# Patient Record
Sex: Male | Born: 1985 | Race: White | Hispanic: No | Smoking: Current every day smoker
Health system: Southern US, Community
[De-identification: ages and names within clinical notes are randomized; demographics above are authoritative.]

## PROBLEM LIST (undated history)

## (undated) DIAGNOSIS — G8929 Other chronic pain: Secondary | ICD-10-CM

## (undated) DIAGNOSIS — M549 Dorsalgia, unspecified: Secondary | ICD-10-CM

---

## 2011-08-06 ENCOUNTER — Emergency Department (HOSPITAL_COMMUNITY)
Admission: EM | Admit: 2011-08-06 | Discharge: 2011-08-06 | Disposition: A | Discharge status: Home / Self Care | Payer: Self-pay | Attending: Emergency Medicine | Admitting: Emergency Medicine

## 2011-08-06 ENCOUNTER — Encounter: Payer: Self-pay | Admitting: Emergency Medicine

## 2011-08-06 DIAGNOSIS — R059 Cough, unspecified: Secondary | ICD-10-CM | POA: Insufficient documentation

## 2011-08-06 DIAGNOSIS — R509 Fever, unspecified: Secondary | ICD-10-CM | POA: Insufficient documentation

## 2011-08-06 DIAGNOSIS — J029 Acute pharyngitis, unspecified: Secondary | ICD-10-CM | POA: Insufficient documentation

## 2011-08-06 DIAGNOSIS — R05 Cough: Secondary | ICD-10-CM | POA: Insufficient documentation

## 2011-08-06 DIAGNOSIS — J069 Acute upper respiratory infection, unspecified: Secondary | ICD-10-CM | POA: Insufficient documentation

## 2011-08-06 MED ORDER — IBUPROFEN 800 MG PO TABS: 800.0000 mg | ORAL_TABLET | Freq: Three times a day (TID) | ORAL | Status: AC | PRN

## 2011-08-06 MED ORDER — IBUPROFEN 800 MG PO TABS
800.0000 mg | ORAL_TABLET | Freq: Once | ORAL | Status: AC
  Administered 2011-08-06: 800 mg via ORAL
  Filled 2011-08-06: qty 1

## 2011-08-06 MED ORDER — BENZONATATE 200 MG PO CAPS: 200.0000 mg | ORAL_CAPSULE | Freq: Three times a day (TID) | ORAL | Status: AC | PRN

## 2011-08-06 MED ORDER — BENZONATATE 100 MG PO CAPS
200.0000 mg | ORAL_CAPSULE | Freq: Once | ORAL | Status: AC
  Administered 2011-08-06: 200 mg via ORAL
  Filled 2011-08-06: qty 2

## 2011-08-06 NOTE — ED Provider Notes (Addendum)
History     CSN: [id number] Arrival date & time: 08/06/2011  4:20 PM   First MD Initiated Contact with Patient 08/06/11 1649      Chief Complaint  Patient presents with  . Sore Throat    (Consider location/radiation/quality/duration/timing/severity/associated sxs/prior treatment) Patient is a 25 y.o. male presenting with pharyngitis. The history is provided by the patient.  Sore Throat This is a new problem. Episode onset: 3 days ago. The problem occurs constantly. The problem has been unchanged. Associated symptoms include congestion, coughing and a sore throat. Pertinent negatives include no abdominal pain, arthralgias, chest pain, chills, diaphoresis, fever, headaches, joint swelling, nausea, neck pain, numbness, rash, swollen glands or weakness. The symptoms are aggravated by swallowing. He has tried nothing for the symptoms. The treatment provided no relief.    History reviewed. No pertinent past medical history.  History reviewed. No pertinent past surgical history.  History reviewed. No pertinent family history.  History  Substance Use Topics  . Smoking status: Current Everyday Smoker    Types: Cigarettes  . Smokeless tobacco: Not on file  . Alcohol Use: Yes      Review of Systems  Constitutional: Negative for fever, chills and diaphoresis.  HENT: Positive for congestion and sore throat. Negative for neck pain.   Eyes: Negative.   Respiratory: Positive for cough. Negative for chest tightness and shortness of breath.   Cardiovascular: Negative for chest pain.  Gastrointestinal: Negative for nausea and abdominal pain.  Genitourinary: Negative.   Musculoskeletal: Negative for joint swelling and arthralgias.  Skin: Negative.  Negative for rash and wound.  Neurological: Negative for dizziness, weakness, light-headedness, numbness and headaches.  Hematological: Negative.   Psychiatric/Behavioral: Negative.     Allergies  Review of patient's allergies indicates no  known allergies.  Home Medications  No current outpatient prescriptions on file.  BP 108/58  Pulse 83  Temp(Src) 97.9 F (36.6 C) (Oral)  Resp 18  Ht 5\' 6"  (1.676 m)  Wt 180 lb (81.647 kg)  BMI 29.05 kg/m2  SpO2 99%  Physical Exam  Nursing note and vitals reviewed. Constitutional: He is oriented to person, place, and time. He appears well-developed and well-nourished.  HENT:  Head: Normocephalic and atraumatic.  Mouth/Throat: Uvula is midline and mucous membranes are normal. Posterior oropharyngeal erythema present. No oropharyngeal exudate or posterior oropharyngeal edema.  Eyes: Conjunctivae are normal.  Neck: Normal range of motion.  Cardiovascular: Normal rate, regular rhythm, normal heart sounds and intact distal pulses.   Pulmonary/Chest: Effort normal and breath sounds normal. No accessory muscle usage. No respiratory distress. He has no wheezes.  Abdominal: Soft. Bowel sounds are normal. There is no tenderness.  Musculoskeletal: Normal range of motion.  Neurological: He is alert and oriented to person, place, and time.  Skin: Skin is warm and dry.  Psychiatric: He has a normal mood and affect.    ED Course  Procedures (including critical care time)   Labs Reviewed  RAPID STREP SCREEN   No results found.   No diagnosis found.    MDM  Strep negative.  Viral uri.  Advised smoking cessation.        Candis Musa, PA 08/06/11 1739  Candis Musa, PA 08/06/11 1754

## 2011-08-06 NOTE — ED Provider Notes (Signed)
Medical screening examination/treatment/procedure(s) were performed by non-physician practitioner and as supervising physician I was immediately available for consultation/collaboration.   Celene Kras, MD 08/06/11 [phone]

## 2011-08-06 NOTE — ED Notes (Signed)
Pt c/o sore throat and runny nose x 3 days. Pt states his son was just hospitalized with pneumonia.

## 2011-08-07 NOTE — ED Provider Notes (Signed)
Medical screening examination/treatment/procedure(s) were performed by non-physician practitioner and as supervising physician I was immediately available for consultation/collaboration.    Diamon Reddinger R Kalem Rockwell, MD 08/07/11 0031 

## 2013-04-23 ENCOUNTER — Encounter (HOSPITAL_COMMUNITY): Payer: Self-pay | Admitting: *Deleted

## 2013-04-23 ENCOUNTER — Emergency Department (HOSPITAL_COMMUNITY)
Admission: EM | Admit: 2013-04-23 | Discharge: 2013-04-23 | Disposition: A | Discharge status: Home / Self Care | Payer: Self-pay | Attending: Emergency Medicine | Admitting: Emergency Medicine

## 2013-04-23 ENCOUNTER — Emergency Department (HOSPITAL_COMMUNITY): Payer: No Typology Code available for payment source

## 2013-04-23 DIAGNOSIS — Y92410 Unspecified street and highway as the place of occurrence of the external cause: Secondary | ICD-10-CM | POA: Insufficient documentation

## 2013-04-23 DIAGNOSIS — T148XXA Other injury of unspecified body region, initial encounter: Secondary | ICD-10-CM

## 2013-04-23 DIAGNOSIS — G8929 Other chronic pain: Secondary | ICD-10-CM | POA: Insufficient documentation

## 2013-04-23 DIAGNOSIS — S335XXA Sprain of ligaments of lumbar spine, initial encounter: Secondary | ICD-10-CM | POA: Insufficient documentation

## 2013-04-23 DIAGNOSIS — F17200 Nicotine dependence, unspecified, uncomplicated: Secondary | ICD-10-CM | POA: Insufficient documentation

## 2013-04-23 DIAGNOSIS — R52 Pain, unspecified: Secondary | ICD-10-CM | POA: Insufficient documentation

## 2013-04-23 DIAGNOSIS — Y9389 Activity, other specified: Secondary | ICD-10-CM | POA: Insufficient documentation

## 2013-04-23 HISTORY — DX: Dorsalgia, unspecified: M54.9

## 2013-04-23 HISTORY — DX: Other chronic pain: G89.29

## 2013-04-23 MED ORDER — HYDROCODONE-ACETAMINOPHEN 5-325 MG PO TABS: ORAL_TABLET | ORAL | Status: DC

## 2013-04-23 MED ORDER — METHOCARBAMOL 500 MG PO TABS: 1000.0000 mg | ORAL_TABLET | Freq: Four times a day (QID) | ORAL | Status: DC | PRN

## 2013-04-23 MED ORDER — NAPROXEN 250 MG PO TABS: 250.0000 mg | ORAL_TABLET | Freq: Two times a day (BID) | ORAL | Status: DC

## 2013-04-23 MED ORDER — IBUPROFEN 400 MG PO TABS
400.0000 mg | ORAL_TABLET | Freq: Once | ORAL | Status: AC
  Administered 2013-04-23: 400 mg via ORAL
  Filled 2013-04-23: qty 1

## 2013-04-23 MED ORDER — OXYCODONE-ACETAMINOPHEN 5-325 MG PO TABS
2.0000 | ORAL_TABLET | Freq: Once | ORAL | Status: AC
  Administered 2013-04-23: 2 via ORAL
  Filled 2013-04-23: qty 2
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
## 2017-02-15 ENCOUNTER — Emergency Department (HOSPITAL_COMMUNITY)
Admission: EM | Admit: 2017-02-15 | Discharge: 2017-02-15 | Disposition: A | Discharge status: Home / Self Care | Payer: Self-pay | Attending: Emergency Medicine | Admitting: Emergency Medicine

## 2017-02-15 ENCOUNTER — Encounter (HOSPITAL_COMMUNITY): Payer: Self-pay

## 2017-02-15 DIAGNOSIS — F17210 Nicotine dependence, cigarettes, uncomplicated: Secondary | ICD-10-CM | POA: Insufficient documentation

## 2017-02-15 DIAGNOSIS — K029 Dental caries, unspecified: Secondary | ICD-10-CM | POA: Insufficient documentation

## 2017-02-15 MED ORDER — NAPROXEN 250 MG PO TABS: 250.0000 mg | ORAL_TABLET | Freq: Two times a day (BID) | ORAL | 0 refills | Status: DC | PRN

## 2017-02-15 MED ORDER — PENICILLIN V POTASSIUM 250 MG PO TABS: 250.0000 mg | ORAL_TABLET | Freq: Four times a day (QID) | ORAL | 0 refills | Status: DC

## 2017-02-15 NOTE — Discharge Instructions (Signed)
Take the prescriptions as directed.  Call your regular dentist on Monday to schedule a follow up appointment within the next week.  Return to the Emergency Department immediately sooner if worsening.  ° °

## 2017-02-15 NOTE — ED Notes (Signed)
Pt reports a bad top back tooth that broke off  [facility], waited three hours and then left because he had to work  No states he awakened this am with redness and swelling to his R side of face  He is swollen to his right Cheek

## 2017-02-15 NOTE — ED Triage Notes (Signed)
Pt reports pain in r side of face and swelling since waking this morning.

## 2017-02-15 NOTE — ED Provider Notes (Signed)
[facility] Provider Note   CSN: [id number] Arrival date & time: 02/15/17  1204     History   Chief Complaint Chief Complaint  Patient presents with  . Facial Pain    HPI Maurice Houston is a 31 y.o. male.  HPI  Pt was seen at 1240. Per pt, c/o gradual onset and persistence of constant right lower tooth "pain" "for a while." States the "tooth broke a while ago."  States he woke up with right sided facial swelling this morning. Denies fevers, no intra-oral edema, no rash, no dysphagia, no neck pain.   The condition is aggravated by nothing. The condition is relieved by nothing. The symptoms have been associated with no other complaints. The patient has no significant history of serious medical conditions.    Past Medical History:  Diagnosis Date  . Chronic back pain     There are no active problems to display for this patient.   History reviewed. No pertinent surgical history.     Home Medications    Prior to Admission medications   Medication Sig Start Date End Date Taking? Authorizing Provider  HYDROcodone-acetaminophen (NORCO/VICODIN) 5-325 MG per tablet 1 or 2 tabs PO q6 hours prn pain 04/23/13   Maurice JesterMcManus, Jaiven Graveline, Maurice Houston  methocarbamol (ROBAXIN) 500 MG tablet Take 2 tablets (1,000 mg total) by mouth 4 (four) times daily as needed (muscle spasm/pain). 04/23/13   Maurice JesterMcManus, Lyle Niblett, Maurice Houston  naproxen (NAPROSYN) 250 MG tablet Take 1 tablet (250 mg total) by mouth 2 (two) times daily with a meal. 04/23/13   Maurice JesterMcManus, Jakobe Blau, Maurice Houston    Family History No family history on file.  Social History Social History  Substance Use Topics  . Smoking status: Current Every Day Smoker    Packs/day: 1.00    Types: Cigarettes  . Smokeless tobacco: Never Used  . Alcohol use Yes     Comment: ocassionally     Allergies   Patient has no known allergies.   Review of Systems Review of Systems ROS: Statement: All systems negative except as marked or noted in the HPI; Constitutional:  Negative for fever and chills. ; ; Eyes: Negative for eye pain and discharge. ; ; ENMT: Positive for dental caries, dental hygiene poor and toothache. Negative for ear pain, bleeding gums, dental injury, facial deformity, hoarseness, nasal congestion, sinus pressure, sore throat, throat swelling and tongue swollen. ; ; Cardiovascular: Negative for chest pain, palpitations, diaphoresis, dyspnea and peripheral edema. ; ; Respiratory: Negative for cough, wheezing and stridor. ; ; Gastrointestinal: Negative for nausea, vomiting, diarrhea and abdominal pain. ; ; Genitourinary: Negative for dysuria, flank pain and hematuria. ; ; Musculoskeletal: Negative for back pain and neck pain. ; ; Skin: Negative for rash and skin lesion. ; ; Neuro: Negative for headache, lightheadedness and neck stiffness. ;    Physical Exam Updated Vital Signs BP 132/86 (BP Location: Right Arm)   Pulse 83   Temp 98 F (36.7 C) (Oral)   Resp 18   Ht 5\' 5"  (1.651 m)   Wt 180 lb (81.6 kg)   SpO2 100%   BMI 29.95 kg/m   Physical Exam 1245: Physical examination: Vital signs and O2 SAT: Reviewed; Constitutional: Well developed, Well nourished, Well hydrated, In no acute distress; Head and Face: Normocephalic, Atraumatic. No facial rash.; Eyes: EOMI, PERRL, No scleral icterus; ENMT: Mouth and pharynx normal, Poor dentition, Widespread dental decay, Left TM normal, Right TM normal, Mucous membranes moist, +upper right 2nd molar with extensive dental decay.  No gingival erythema, edema, fluctuance, or drainage.  No intra-oral edema. No submandibular or sublingual edema. No hoarse voice, no drooling, no stridor. No trismus. ; Neck: Supple, Full range of motion, No lymphadenopathy; Cardiovascular: Regular rate and rhythm, No murmur, rub, or gallop; Respiratory: Breath sounds clear & equal bilaterally, No rales, rhonchi, wheezes, Normal respiratory effort/excursion; Chest: Nontender, Movement normal; Extremities: Pulses normal, No tenderness,  No edema; Neuro: AA&Ox3, Major CN grossly intact.  No gross focal motor or sensory deficits in extremities.; Skin: Color normal, No rash, No petechiae, Warm, Dry   ED Treatments / Results  Labs (all labs ordered are listed, but only abnormal results are displayed)   EKG  EKG Interpretation None       Radiology   Procedures Procedures (including critical care time)  Medications Ordered in ED Medications - No data to display   Initial Impression / Assessment and Plan / ED Course  I have reviewed the triage vital signs and the nursing notes.  Pertinent labs & imaging results that were available during my care of the patient were reviewed by me and considered in my medical decision making (see chart for details).  MDM Reviewed: previous chart, nursing note and vitals   1245:  Pt encouraged to f/u with dentist or oral surgeon for his dental needs for good continuity of care and definitive treatment.  Pt verb understanding.    Final Clinical Impressions(s) / ED Diagnoses   Final diagnoses:  None    New Prescriptions New Prescriptions   No medications on file     Maurice Jester, Maurice Houston 02/19/17 1610

## 2017-06-07 ENCOUNTER — Encounter (HOSPITAL_COMMUNITY): Payer: Self-pay | Admitting: *Deleted

## 2017-06-07 ENCOUNTER — Emergency Department (HOSPITAL_COMMUNITY)
Admission: EM | Admit: 2017-06-07 | Discharge: 2017-06-07 | Disposition: A | Discharge status: Home / Self Care | Payer: Self-pay | Attending: Emergency Medicine | Admitting: Emergency Medicine

## 2017-06-07 DIAGNOSIS — K047 Periapical abscess without sinus: Secondary | ICD-10-CM | POA: Insufficient documentation

## 2017-06-07 DIAGNOSIS — F17210 Nicotine dependence, cigarettes, uncomplicated: Secondary | ICD-10-CM | POA: Insufficient documentation

## 2017-06-07 DIAGNOSIS — K0889 Other specified disorders of teeth and supporting structures: Secondary | ICD-10-CM | POA: Insufficient documentation

## 2017-06-07 MED ORDER — PENICILLIN V POTASSIUM 500 MG PO TABS: 500.0000 mg | ORAL_TABLET | Freq: Four times a day (QID) | ORAL | 0 refills | Status: AC

## 2017-06-07 MED ORDER — OXYCODONE-ACETAMINOPHEN 5-325 MG PO TABS
1.0000 | ORAL_TABLET | Freq: Once | ORAL | Status: AC
  Administered 2017-06-07: 1 via ORAL
  Filled 2017-06-07: qty 1

## 2017-06-07 MED ORDER — TRAMADOL HCL 50 MG PO TABS: 50.0000 mg | ORAL_TABLET | Freq: Four times a day (QID) | ORAL | 0 refills | Status: AC | PRN

## 2017-06-07 MED ORDER — CLINDAMYCIN PHOSPHATE 600 MG/50ML IV SOLN
600.0000 mg | Freq: Once | INTRAVENOUS | Status: AC
  Administered 2017-06-07: 600 mg via INTRAVENOUS
  Filled 2017-06-07: qty 50

## 2017-06-07 NOTE — Discharge Instructions (Signed)
Follow up with a dentist next week. °

## 2017-06-07 NOTE — ED Provider Notes (Signed)
[facility] Provider Note   CSN: [id number] Arrival date & time: 06/07/17  1837     History   Chief Complaint Chief Complaint  Patient presents with  . Facial Swelling    HPI Maurice Houston is a 31 y.o. male.  Patient complains of swelling to the right side of his face around his cheek. Patient has a history of poor mentation. He has been hurting in his right upper teeth for a while   The history is provided by the patient. No language interpreter was used.  Illness  This is a recurrent problem. The current episode started more than 2 days ago. The problem occurs constantly. The problem has not changed since onset.Pertinent negatives include no chest pain, no abdominal pain and no headaches. Nothing aggravates the symptoms. Nothing relieves the symptoms. He has tried nothing for the symptoms. The treatment provided no relief.    Past Medical History:  Diagnosis Date  . Chronic back pain     There are no active problems to display for this patient.   History reviewed. No pertinent surgical history.     Home Medications    Prior to Admission medications   Medication Sig Start Date End Date Taking? Authorizing Provider  penicillin v potassium (VEETID) 500 MG tablet Take 1 tablet (500 mg total) by mouth 4 (four) times daily. 06/07/17 06/14/17  Bethann Berkshire, MD  traMADol (ULTRAM) 50 MG tablet Take 1 tablet (50 mg total) by mouth every 6 (six) hours as needed. 06/07/17   Bethann Berkshire, MD    Family History History reviewed. No pertinent family history.  Social History Social History  Substance Use Topics  . Smoking status: Current Every Day Smoker    Packs/day: 1.00    Types: Cigarettes  . Smokeless tobacco: Never Used  . Alcohol use No     Comment: ocassionally     Allergies   Patient has no known allergies.   Review of Systems Review of Systems  Constitutional: Negative for appetite change and fatigue.  HENT: Negative for congestion, ear discharge  and sinus pressure.        Swelling in face  Eyes: Negative for discharge.  Respiratory: Negative for cough.   Cardiovascular: Negative for chest pain.  Gastrointestinal: Negative for abdominal pain and diarrhea.  Genitourinary: Negative for frequency and hematuria.  Musculoskeletal: Negative for back pain.  Skin: Negative for rash.  Neurological: Negative for seizures and headaches.  Psychiatric/Behavioral: Negative for hallucinations.     Physical Exam Updated Vital Signs BP 115/74   Pulse 69   Temp 98.1 F (36.7 C) (Oral)   Resp 19   Ht 5\' 4"  (1.626 m)   Wt 81.6 kg (180 lb)   SpO2 100%   BMI 30.90 kg/m   Physical Exam  Constitutional: He is oriented to person, place, and time. He appears well-developed.  HENT:  Head: Normocephalic.  Facial swelling. Tender right upper tooth  Eyes: Conjunctivae and EOM are normal. No scleral icterus.  Neck: Neck supple. No thyromegaly present.  Cardiovascular: Normal rate and regular rhythm.  Exam reveals no gallop and no friction rub.   No murmur heard. Pulmonary/Chest: No stridor. He has no wheezes. He has no rales. He exhibits no tenderness.  Abdominal: He exhibits no distension. There is no tenderness. There is no rebound.  Musculoskeletal: Normal range of motion. He exhibits no edema.  Lymphadenopathy:    He has no cervical adenopathy.  Neurological: He is oriented to person, place, and time.  He exhibits normal muscle tone. Coordination normal.  Skin: No rash noted. No erythema.  Psychiatric: He has a normal mood and affect. His behavior is normal.     ED Treatments / Results  Labs (all labs ordered are listed, but only abnormal results are displayed) Labs Reviewed - No data to display  EKG  EKG Interpretation None       Radiology No results found.  Procedures Procedures (including critical care time)  Medications Ordered in ED Medications  clindamycin (CLEOCIN) IVPB 600 mg (0 mg Intravenous Stopped 06/07/17  2006)  oxyCODONE-acetaminophen (PERCOCET/ROXICET) 5-325 MG per tablet 1 tablet (1 tablet Oral Given 06/07/17 1932)     Initial Impression / Assessment and Plan / ED Course  I have reviewed the triage vital signs and the nursing notes.  Pertinent labs & imaging results that were available during my care of the patient were reviewed by me and considered in my medical decision making (see chart for details).   patient with a dental abscess. He will be placed on penicillin and Ultram and will follow-up with the dentist    Final Clinical Impressions(s) / ED Diagnoses   Final diagnoses:  Dental abscess    New Prescriptions Discharge Medication List as of 06/07/2017  7:51 PM    START taking these medications   Details  penicillin v potassium (VEETID) 500 MG tablet Take 1 tablet (500 mg total) by mouth 4 (four) times daily., Starting Sat 06/07/2017, Until Sat 06/14/2017, Print    traMADol (ULTRAM) 50 MG tablet Take 1 tablet (50 mg total) by mouth every 6 (six) hours as needed., Starting Sat 06/07/2017, Print         Bethann BerkshireZammit, Matina Rodier, MD 06/07/17 2153

## 2017-06-07 NOTE — ED Triage Notes (Signed)
Pt with right facial swelling upon waking up today with pain.

## 2017-06-07 NOTE — ED Notes (Signed)
Pt reports awakening this am with swollen face- Also reports a bad tooth "for while".  Pt eye and cheek area swollen and reddened  Dr Estell HarpinZammit in to assess

## 2022-09-16 DIAGNOSIS — F1124 Opioid dependence with opioid-induced mood disorder: Secondary | ICD-10-CM | POA: Diagnosis not present

## 2022-09-16 DIAGNOSIS — Z79899 Other long term (current) drug therapy: Secondary | ICD-10-CM | POA: Diagnosis not present

## 2022-09-25 DIAGNOSIS — Z79899 Other long term (current) drug therapy: Secondary | ICD-10-CM | POA: Diagnosis not present

## 2022-09-25 DIAGNOSIS — F1320 Sedative, hypnotic or anxiolytic dependence, uncomplicated: Secondary | ICD-10-CM | POA: Diagnosis not present

## 2022-09-25 DIAGNOSIS — F1519 Other stimulant abuse with unspecified stimulant-induced disorder: Secondary | ICD-10-CM | POA: Diagnosis not present

## 2022-09-25 DIAGNOSIS — F1124 Opioid dependence with opioid-induced mood disorder: Secondary | ICD-10-CM | POA: Diagnosis not present

## 2022-09-25 DIAGNOSIS — Z5181 Encounter for therapeutic drug level monitoring: Secondary | ICD-10-CM | POA: Diagnosis not present

## 2022-09-25 DIAGNOSIS — R69 Illness, unspecified: Secondary | ICD-10-CM | POA: Diagnosis not present

## 2022-09-25 DIAGNOSIS — F411 Generalized anxiety disorder: Secondary | ICD-10-CM | POA: Diagnosis not present

## 2022-09-25 DIAGNOSIS — Z6835 Body mass index (BMI) 35.0-35.9, adult: Secondary | ICD-10-CM | POA: Diagnosis not present

## 2022-10-02 DIAGNOSIS — Z5181 Encounter for therapeutic drug level monitoring: Secondary | ICD-10-CM | POA: Diagnosis not present

## 2022-10-02 DIAGNOSIS — F10120 Alcohol abuse with intoxication, uncomplicated: Secondary | ICD-10-CM | POA: Diagnosis not present

## 2022-10-02 DIAGNOSIS — F1124 Opioid dependence with opioid-induced mood disorder: Secondary | ICD-10-CM | POA: Diagnosis not present

## 2022-10-02 DIAGNOSIS — F1519 Other stimulant abuse with unspecified stimulant-induced disorder: Secondary | ICD-10-CM | POA: Diagnosis not present

## 2022-10-02 DIAGNOSIS — Z79899 Other long term (current) drug therapy: Secondary | ICD-10-CM | POA: Diagnosis not present

## 2022-10-02 DIAGNOSIS — R69 Illness, unspecified: Secondary | ICD-10-CM | POA: Diagnosis not present

## 2022-10-10 DIAGNOSIS — Z5181 Encounter for therapeutic drug level monitoring: Secondary | ICD-10-CM | POA: Diagnosis not present

## 2022-10-10 DIAGNOSIS — F1519 Other stimulant abuse with unspecified stimulant-induced disorder: Secondary | ICD-10-CM | POA: Diagnosis not present

## 2022-10-10 DIAGNOSIS — Z79899 Other long term (current) drug therapy: Secondary | ICD-10-CM | POA: Diagnosis not present

## 2022-10-10 DIAGNOSIS — F1124 Opioid dependence with opioid-induced mood disorder: Secondary | ICD-10-CM | POA: Diagnosis not present

## 2022-10-10 DIAGNOSIS — R69 Illness, unspecified: Secondary | ICD-10-CM | POA: Diagnosis not present

## 2022-10-17 DIAGNOSIS — F1519 Other stimulant abuse with unspecified stimulant-induced disorder: Secondary | ICD-10-CM | POA: Diagnosis not present

## 2022-10-17 DIAGNOSIS — Z79899 Other long term (current) drug therapy: Secondary | ICD-10-CM | POA: Diagnosis not present

## 2022-10-17 DIAGNOSIS — F1124 Opioid dependence with opioid-induced mood disorder: Secondary | ICD-10-CM | POA: Diagnosis not present

## 2022-10-17 DIAGNOSIS — R69 Illness, unspecified: Secondary | ICD-10-CM | POA: Diagnosis not present

## 2022-12-19 ENCOUNTER — Other Ambulatory Visit: Payer: Self-pay

## 2022-12-19 ENCOUNTER — Emergency Department (HOSPITAL_COMMUNITY)
Admission: EM | Admit: 2022-12-19 | Discharge: 2022-12-19 | Disposition: A | Discharge status: Home / Self Care | Payer: Self-pay | Attending: Emergency Medicine | Admitting: Emergency Medicine

## 2022-12-19 DIAGNOSIS — T50904A Poisoning by unspecified drugs, medicaments and biological substances, undetermined, initial encounter: Secondary | ICD-10-CM

## 2022-12-19 DIAGNOSIS — T401X4A Poisoning by heroin, undetermined, initial encounter: Secondary | ICD-10-CM | POA: Insufficient documentation

## 2022-12-19 LAB — COMPREHENSIVE METABOLIC PANEL
ALT: 29 U/L (ref 0–44)
AST: 23 U/L (ref 15–41)
Albumin: 4.3 g/dL (ref 3.5–5.0)
Alkaline Phosphatase: 62 U/L (ref 38–126)
Anion gap: 11 (ref 5–15)
BUN: 16 mg/dL (ref 6–20)
CO2: 24 mmol/L (ref 22–32)
Calcium: 8.8 mg/dL — ABNORMAL LOW (ref 8.9–10.3)
Chloride: 100 mmol/L (ref 98–111)
Creatinine, Ser: 1.1 mg/dL (ref 0.61–1.24)
GFR, Estimated: >60 mL/min (ref >60)
Glucose, Bld: 85 mg/dL (ref 70–99)
Potassium: 3.4 mmol/L — ABNORMAL LOW (ref 3.5–5.1)
Sodium: 135 mmol/L (ref 135–145)
Total Bilirubin: 0.6 mg/dL (ref 0.3–1.2)
Total Protein: 7.1 g/dL (ref 6.5–8.1)

## 2022-12-19 LAB — CBC WITH DIFFERENTIAL/PLATELET
Abs Immature Granulocytes: 0.04 10*3/uL (ref 0.00–0.07)
Basophils Absolute: 0 10*3/uL (ref 0.0–0.1)
Basophils Relative: 0 %
Eosinophils Absolute: 0.2 10*3/uL (ref 0.0–0.5)
Eosinophils Relative: 3 %
HCT: 35.1 % — ABNORMAL LOW (ref 39.0–52.0)
Hemoglobin: 11.8 g/dL — ABNORMAL LOW (ref 13.0–17.0)
Immature Granulocytes: 1 %
Lymphocytes Relative: 25 %
Lymphs Abs: 1.9 10*3/uL (ref 0.7–4.0)
MCH: 27.8 pg (ref 26.0–34.0)
MCHC: 33.6 g/dL (ref 30.0–36.0)
MCV: 82.8 fL (ref 80.0–100.0)
Monocytes Absolute: 0.6 10*3/uL (ref 0.1–1.0)
Monocytes Relative: 7 %
Neutro Abs: 4.9 10*3/uL (ref 1.7–7.7)
Neutrophils Relative %: 64 %
Platelets: 205 10*3/uL (ref 150–400)
RBC: 4.24 MIL/uL (ref 4.22–5.81)
RDW: 13.2 % (ref 11.5–15.5)
WBC: 7.6 10*3/uL (ref 4.0–10.5)
nRBC: 0 % (ref 0.0–0.2)

## 2022-12-19 LAB — CBG MONITORING, ED: Glucose-Capillary: 92 mg/dL (ref 70–99)

## 2022-12-19 LAB — ETHANOL: Alcohol, Ethyl (B): <10 mg/dL (ref <10)

## 2022-12-19 MED ORDER — NALOXONE HCL 0.4 MG/ML IJ SOLN
0.4000 mg | Freq: Once | INTRAMUSCULAR | Status: AC
  Administered 2022-12-19: 0.4 mg via INTRAVENOUS
  Filled 2022-12-19: qty 1

## 2022-12-19 NOTE — ED Notes (Signed)
EDP assessed pt during triage.  
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
## 2022-12-19 NOTE — ED Notes (Signed)
Pt requesting to leave. EDP aware and spoke with pt about risks of leaving.

## 2022-12-19 NOTE — Discharge Instructions (Addendum)
Do not hesitate to return here for concerning changes in your condition.

## 2022-12-19 NOTE — ED Notes (Signed)
Pt verbalized he did not take any drugs or drink any alcohol. Pt is stating he is just tired from work.

## 2022-12-19 NOTE — ED Provider Notes (Signed)
[facility] Provider Note   CSN: [id number] Arrival date & time: 12/19/22  1816     History  Chief Complaint  Patient presents with   Drug Overdose    SARTHAK TONN is a 37 y.o. male.  HPI Patient arrives via EMS after being found unresponsive.  Patient is self acknowledged significant other of the patient we took care of about 2 hours ago after she arrived after witnessed heroin overdose.  Per EMS/police the patient was seen to be going back to the car from which he and the significant other arrived in a store prior to her being found unresponsive.  They note that when they initially took care of the other individual this patient was awake, alert, and a normal interactive manner.  However, when called back to evaluate him he was found listless, minimally interactive.  However, no hemodynamic stability, no evidence for trauma.    Home Medications Prior to Admission medications   Medication Sig Start Date End Date Taking? Authorizing Provider  traMADol (ULTRAM) 50 MG tablet Take 1 tablet (50 mg total) by mouth every 6 (six) hours as needed. 06/07/17   Milton Ferguson, MD      Allergies    Patient has no known allergies.    Review of Systems   Review of Systems  Unable to perform ROS: Acuity of condition    Physical Exam Updated Vital Signs BP 115/72   Pulse 79   Temp (!) 97.3 F (36.3 C) (Axillary)   Resp 10   Ht '5\' 11"'$  (1.803 m)   Wt 101.3 kg   SpO2 95%   BMI 31.16 kg/m  Physical Exam Vitals and nursing note reviewed.  Constitutional:      Appearance: He is well-developed.     Comments: Clinically intoxicated with some substance adult male arrives via wheelchair accompanied by EMS personnel who provide history.  The patient denies ingestion, smoking, denies pain, acknowledges that he is with the patient we took care of earlier in the day.  HENT:     Head: Normocephalic and atraumatic.  Eyes:     Conjunctiva/sclera:  Conjunctivae normal.  Cardiovascular:     Rate and Rhythm: Normal rate and regular rhythm.  Pulmonary:     Effort: Pulmonary effort is normal. No respiratory distress.     Breath sounds: No stridor.  Abdominal:     General: There is no distension.  Skin:    General: Skin is warm and dry.  Neurological:     General: No focal deficit present.     Comments: Slow to respond, listless, but no focal deficit aside from sleepiness.     ED Results / Procedures / Treatments   Labs (all labs ordered are listed, but only abnormal results are displayed) Labs Reviewed  CBC WITH DIFFERENTIAL/PLATELET - Abnormal; Notable for the following components:      Result Value   Hemoglobin 11.8 (*)    HCT 35.1 (*)    All other components within normal limits  COMPREHENSIVE METABOLIC PANEL  ETHANOL  RAPID URINE DRUG SCREEN, HOSP PERFORMED  URINALYSIS, ROUTINE W REFLEX MICROSCOPIC  CBG MONITORING, ED    EKG None  Radiology No results found.  Procedures Procedures    Medications Ordered in ED Medications  naloxone [facility]) injection 0.4 mg (0.4 mg Intravenous Given 12/19/22 1835)    ED Course/ Medical Decision Making/ A&P Clinical Course as of 12/19/22 1852  Thu Dec 19, 2022  1829 Urine rapid  drug screen (hosp performed) [AB]    Clinical Course User Index [AB] Susanne Greenhouse, [place], Student-PA                             Medical Decision Making Adult male presents after possible intoxication no evidence for trauma, with concern for altered mental status, differential including ingestants, polypharmacy, overdose considered.  Patient placed on monitors, labs sent, Narcan provided.  Amount and/or Complexity of Data Reviewed Independent Historian: EMS Labs: ordered. Decision-making details documented in ED Course.  Risk Prescription drug management. Decision regarding hospitalization.   6:52 PM Patient awake, alert, speaking clearly.  Patient has received Narcan, requests  discharge.  I have informed that her evaluation is not complete, but he is awake alert and left to have capacity.  He was encouraged to stay, also encouraged to return should he develop any new, or concerning changes.  He is leaving [place], but he acknowledges this risk.  Patient discharged per request.        Final Clinical Impression(s) / ED Diagnoses Final diagnoses:  Overdose of undetermined intent, initial encounter    Rx / DC Orders ED Discharge Orders     None         Carmin Muskrat, MD 12/19/22 [phone]

## 2022-12-30 DIAGNOSIS — F1120 Opioid dependence, uncomplicated: Secondary | ICD-10-CM | POA: Diagnosis not present

## 2022-12-30 DIAGNOSIS — F1123 Opioid dependence with withdrawal: Secondary | ICD-10-CM | POA: Diagnosis not present

## 2023-01-01 DIAGNOSIS — F1120 Opioid dependence, uncomplicated: Secondary | ICD-10-CM | POA: Diagnosis not present

## 2023-01-01 DIAGNOSIS — F1123 Opioid dependence with withdrawal: Secondary | ICD-10-CM | POA: Diagnosis not present

## 2023-01-17 DIAGNOSIS — F1120 Opioid dependence, uncomplicated: Secondary | ICD-10-CM | POA: Diagnosis not present

## 2024-09-20 ENCOUNTER — Telehealth: Payer: Self-pay | Admitting: Diagnostic Neuroimaging

## 2024-09-20 NOTE — Telephone Encounter (Signed)
 Received sleep referral from [facility] department Rosina Mulch, NP for suspected sleep apnea. Placed in sleep referrals box

## 2024-10-19 ENCOUNTER — Encounter: Payer: Self-pay | Admitting: Neurology

## 2024-10-19 ENCOUNTER — Institutional Professional Consult (permissible substitution): Admitting: Neurology

## 2024-10-19 ENCOUNTER — Telehealth: Payer: Self-pay | Admitting: Neurology

## 2024-10-19 NOTE — Telephone Encounter (Signed)
 Mailed letter informing pt of missed apt

## 2024-12-08 ENCOUNTER — Ambulatory Visit: Admitting: Urology
